# Patient Record
Sex: Male | Born: 1995 | Race: Black or African American | Hispanic: No | Marital: Single | Smoking: Never smoker
Health system: Southern US, Community
[De-identification: ages and names within clinical notes are randomized; demographics above are authoritative.]

## PROBLEM LIST (undated history)

## (undated) DIAGNOSIS — R159 Full incontinence of feces: Secondary | ICD-10-CM

## (undated) DIAGNOSIS — Q059 Spina bifida, unspecified: Secondary | ICD-10-CM

## (undated) DIAGNOSIS — I10 Essential (primary) hypertension: Secondary | ICD-10-CM

## (undated) DIAGNOSIS — N179 Acute kidney failure, unspecified: Secondary | ICD-10-CM

## (undated) DIAGNOSIS — R32 Unspecified urinary incontinence: Secondary | ICD-10-CM

## (undated) HISTORY — DX: Unspecified urinary incontinence: R32

## (undated) HISTORY — DX: Full incontinence of feces: R15.9

## (undated) HISTORY — DX: Acute kidney failure, unspecified: N17.9

## (undated) HISTORY — DX: Spina bifida, unspecified: Q05.9

## (undated) HISTORY — DX: Essential (primary) hypertension: I10

## (undated) HISTORY — PX: TYMPANOSTOMY TUBE PLACEMENT: SHX32

---
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
## 2016-08-11 DIAGNOSIS — R509 Fever, unspecified: Secondary | ICD-10-CM | POA: Diagnosis not present

## 2016-08-11 DIAGNOSIS — Q057 Lumbar spina bifida without hydrocephalus: Secondary | ICD-10-CM | POA: Diagnosis not present

## 2016-08-11 DIAGNOSIS — R1084 Generalized abdominal pain: Secondary | ICD-10-CM | POA: Diagnosis not present

## 2016-08-11 DIAGNOSIS — N390 Urinary tract infection, site not specified: Secondary | ICD-10-CM | POA: Diagnosis not present

## 2016-11-09 DIAGNOSIS — Q059 Spina bifida, unspecified: Secondary | ICD-10-CM | POA: Diagnosis not present

## 2016-11-09 DIAGNOSIS — Z0000 Encounter for general adult medical examination without abnormal findings: Secondary | ICD-10-CM | POA: Diagnosis not present

## 2016-11-09 DIAGNOSIS — I10 Essential (primary) hypertension: Secondary | ICD-10-CM | POA: Diagnosis not present

## 2016-11-09 DIAGNOSIS — L8990 Pressure ulcer of unspecified site, unspecified stage: Secondary | ICD-10-CM | POA: Diagnosis not present

## 2016-11-09 DIAGNOSIS — R32 Unspecified urinary incontinence: Secondary | ICD-10-CM | POA: Diagnosis not present

## 2016-11-09 DIAGNOSIS — R159 Full incontinence of feces: Secondary | ICD-10-CM | POA: Diagnosis not present

## 2017-01-06 ENCOUNTER — Encounter: Payer: Self-pay | Admitting: *Deleted

## 2017-01-07 ENCOUNTER — Ambulatory Visit (INDEPENDENT_AMBULATORY_CARE_PROVIDER_SITE_OTHER): Payer: Medicare HMO | Admitting: Neurology

## 2017-01-07 ENCOUNTER — Encounter: Payer: Self-pay | Admitting: Neurology

## 2017-01-07 DIAGNOSIS — Q057 Lumbar spina bifida without hydrocephalus: Secondary | ICD-10-CM

## 2017-01-07 DIAGNOSIS — Q059 Spina bifida, unspecified: Secondary | ICD-10-CM | POA: Insufficient documentation

## 2017-01-07 NOTE — Progress Notes (Signed)
Reason for visit: Spina bifida  Referring physician: Dr. Burnadette Pop Gabriel Bryant is a 21 y.o. male  History of present illness:  Gabriel Bryant is a 21 year old right-handed black male with a history of spina bifida with impairment of the L5 and sacral levels.  The patient is wheelchair-bound, he has a problem with a hypotonic bladder and difficulty controlling the bowels.  He has chronic constipation, he may have some diarrhea at times.  He is having a bowel movement about once every other day.  He does in and out catheterizations every 3-4 hours.  He denies any discomfort, he does have some numbness in the feet.  He has had a small pressure sore on the buttock area that he is monitoring.  The patient denies any neck pain or pain down the arms or numbness or weakness of the arms.  He does report relatively frequent urinary tract infections.  He comes to this office for an evaluation.   Past Medical History:  Diagnosis Date  . Acute kidney failure (HCC) 2013  . Bowel and bladder incontinence   . Hypertension   . Spina bifida [facility])     Past Surgical History:  Procedure Laterality Date  . LEG SURGERY  2000  . TYMPANOSTOMY TUBE PLACEMENT      History reviewed. No pertinent family history.  Social history:  reports that he has never smoked. He has never used smokeless tobacco. He reports that he drinks alcohol. He reports that he does not use drugs.  Medications:  Prior to Admission medications   Medication Sig Start Date End Date Taking? Authorizing Provider  chlorthalidone (HYGROTON) 25 MG tablet Take 25 mg by mouth daily.   Yes [provider]     Not on File  ROS:  Out of a complete 14 system review of symptoms, the patient complains only of the following symptoms, and all other reviewed systems are negative.  Fecal incontinence, diarrhea, constipation Urinary incontinence  Blood pressure 132/68, pulse 74, height 4\' 10"  (1.473 m).  Physical  Exam  General: The patient is alert and cooperative at the time of the examination.  Eyes: Pupils are equal, round, and reactive to light. Discs are flat bilaterally.  Neck: The neck is supple, no carotid bruits are noted.  Respiratory: The respiratory examination is clear.  Cardiovascular: The cardiovascular examination reveals a regular rate and rhythm, no obvious murmurs or rubs are noted.  Skin: Extremities are without significant edema.  Atrophy below the knees are seen bilaterally.  Neurologic Exam  Mental status: The patient is alert and oriented x 3 at the time of the examination. The patient has apparent normal recent and remote memory, with an apparently normal attention span and concentration ability.  Cranial nerves: Facial symmetry is present. There is good sensation of the face to pinprick and soft touch bilaterally. The strength of the facial muscles and the muscles to head turning and shoulder shrug are normal bilaterally. Speech is well enunciated, no aphasia or dysarthria is noted. Extraocular movements are full. Visual fields are full. The tongue is midline, and the patient has symmetric elevation of the soft palate. No obvious hearing deficits are noted.  Motor: The motor testing reveals 5 over 5 strength of the upper extremities.  With the legs, the patient has good strength with hip flexion, knee extension, adduction of the thighs, but there is 4-/5 strength with abduction of the thighs and with knee flexion.  No voluntary control with dorsi or plantarflexion  of the feet is seen.  Good symmetric motor tone is noted in the arms, decreased motor tone below the knees in the legs.  Sensory: Sensory testing is intact to pinprick, soft touch, vibration sensation, and position sense on all 4 extremities, with exception of decreased pinprick sensation on the feet bilaterally. No evidence of extinction is noted.  Coordination: Cerebellar testing reveals good finger-nose-finger  and heel-to-shin bilaterally.  Gait and station: Gait was not tested, the patient is nonambulatory, in a wheelchair  Reflexes: Deep tendon reflexes are symmetric and normal bilaterally, with exception of decreased ankle jerk reflexes bilaterally. Toes are downgoing bilaterally.   Assessment/Plan:  1.  Spina bifida  The patient has weakness from the L5 level down.  He has a hypotonic bladder, he catheterizes himself every 3-4 hours.  I have recommended that he be on vitamin C 500 mg 3 times daily and take cranberry tablets.  The patient may need a referral to urology, he did not wish to have one at this time.  The bowel regimen may be facilitated by taking MiraLAX on a daily basis and take the Dulcolax suppositories if he goes more than 2 or 3 days without a bowel movement.  The patient will follow-up in 1 year, sooner if needed.  The patient is to monitor the pressure sore closely.  Gabriel Bryant. Gabriel Levell Tavano MD 01/07/2017 11:35 AM  [facility] [address] [place], [place] [id number]  Phone [phone] Fax [phone]

## 2017-01-07 NOTE — Patient Instructions (Addendum)
   Take vitamin C 500 mg three times a day. Take cranberry tablets.

## 2017-02-09 DIAGNOSIS — R339 Retention of urine, unspecified: Secondary | ICD-10-CM | POA: Diagnosis not present

## 2017-03-03 DIAGNOSIS — N3281 Overactive bladder: Secondary | ICD-10-CM | POA: Diagnosis not present

## 2017-03-03 DIAGNOSIS — R338 Other retention of urine: Secondary | ICD-10-CM | POA: Diagnosis not present

## 2017-03-25 DIAGNOSIS — R3914 Feeling of incomplete bladder emptying: Secondary | ICD-10-CM | POA: Diagnosis not present

## 2017-03-25 DIAGNOSIS — R338 Other retention of urine: Secondary | ICD-10-CM | POA: Diagnosis not present

## 2017-04-07 DIAGNOSIS — N310 Uninhibited neuropathic bladder, not elsewhere classified: Secondary | ICD-10-CM | POA: Diagnosis not present

## 2017-04-07 DIAGNOSIS — N311 Reflex neuropathic bladder, not elsewhere classified: Secondary | ICD-10-CM | POA: Diagnosis not present

## 2017-04-07 DIAGNOSIS — N3941 Urge incontinence: Secondary | ICD-10-CM | POA: Diagnosis not present

## 2017-04-07 DIAGNOSIS — N270 Small kidney, unilateral: Secondary | ICD-10-CM | POA: Diagnosis not present

## 2017-05-02 DIAGNOSIS — R3914 Feeling of incomplete bladder emptying: Secondary | ICD-10-CM | POA: Diagnosis not present

## 2017-05-02 DIAGNOSIS — N311 Reflex neuropathic bladder, not elsewhere classified: Secondary | ICD-10-CM | POA: Diagnosis not present

## 2017-05-12 DIAGNOSIS — N3281 Overactive bladder: Secondary | ICD-10-CM | POA: Diagnosis not present

## 2017-05-12 DIAGNOSIS — N3941 Urge incontinence: Secondary | ICD-10-CM | POA: Diagnosis not present

## 2017-05-19 DIAGNOSIS — R198 Other specified symptoms and signs involving the digestive system and abdomen: Secondary | ICD-10-CM | POA: Diagnosis not present

## 2017-05-26 DIAGNOSIS — R339 Retention of urine, unspecified: Secondary | ICD-10-CM | POA: Diagnosis not present

## 2017-06-29 DIAGNOSIS — R339 Retention of urine, unspecified: Secondary | ICD-10-CM | POA: Diagnosis not present

## 2017-07-26 DIAGNOSIS — R198 Other specified symptoms and signs involving the digestive system and abdomen: Secondary | ICD-10-CM | POA: Diagnosis not present

## 2017-07-28 ENCOUNTER — Other Ambulatory Visit: Payer: Self-pay | Admitting: Gastroenterology

## 2017-07-28 ENCOUNTER — Ambulatory Visit
Admission: RE | Admit: 2017-07-28 | Discharge: 2017-07-28 | Disposition: A | Discharge status: Home / Self Care | Payer: Self-pay | Source: Ambulatory Visit | Attending: Gastroenterology | Admitting: Gastroenterology

## 2017-07-28 DIAGNOSIS — Q059 Spina bifida, unspecified: Secondary | ICD-10-CM

## 2017-07-28 DIAGNOSIS — K5900 Constipation, unspecified: Secondary | ICD-10-CM | POA: Diagnosis not present

## 2017-07-28 DIAGNOSIS — R198 Other specified symptoms and signs involving the digestive system and abdomen: Secondary | ICD-10-CM

## 2017-07-28 DIAGNOSIS — R197 Diarrhea, unspecified: Secondary | ICD-10-CM | POA: Diagnosis not present

## 2017-08-04 DIAGNOSIS — R339 Retention of urine, unspecified: Secondary | ICD-10-CM | POA: Diagnosis not present

## 2017-08-11 DIAGNOSIS — N3941 Urge incontinence: Secondary | ICD-10-CM | POA: Diagnosis not present

## 2017-08-11 DIAGNOSIS — N311 Reflex neuropathic bladder, not elsewhere classified: Secondary | ICD-10-CM | POA: Diagnosis not present

## 2017-08-11 DIAGNOSIS — N310 Uninhibited neuropathic bladder, not elsewhere classified: Secondary | ICD-10-CM | POA: Diagnosis not present

## 2017-09-07 DIAGNOSIS — R339 Retention of urine, unspecified: Secondary | ICD-10-CM | POA: Diagnosis not present

## 2017-09-07 DIAGNOSIS — N319 Neuromuscular dysfunction of bladder, unspecified: Secondary | ICD-10-CM | POA: Diagnosis not present

## 2017-10-07 DIAGNOSIS — R339 Retention of urine, unspecified: Secondary | ICD-10-CM | POA: Diagnosis not present

## 2017-10-07 DIAGNOSIS — N319 Neuromuscular dysfunction of bladder, unspecified: Secondary | ICD-10-CM | POA: Diagnosis not present

## 2017-11-01 DIAGNOSIS — Z823 Family history of stroke: Secondary | ICD-10-CM | POA: Diagnosis not present

## 2017-11-01 DIAGNOSIS — R32 Unspecified urinary incontinence: Secondary | ICD-10-CM | POA: Diagnosis not present

## 2017-11-01 DIAGNOSIS — Z91040 Latex allergy status: Secondary | ICD-10-CM | POA: Diagnosis not present

## 2017-11-01 DIAGNOSIS — K5900 Constipation, unspecified: Secondary | ICD-10-CM | POA: Diagnosis not present

## 2017-11-01 DIAGNOSIS — I10 Essential (primary) hypertension: Secondary | ICD-10-CM | POA: Diagnosis not present

## 2017-11-01 DIAGNOSIS — R269 Unspecified abnormalities of gait and mobility: Secondary | ICD-10-CM | POA: Diagnosis not present

## 2017-11-01 DIAGNOSIS — Z833 Family history of diabetes mellitus: Secondary | ICD-10-CM | POA: Diagnosis not present

## 2017-11-01 DIAGNOSIS — Z87440 Personal history of urinary (tract) infections: Secondary | ICD-10-CM | POA: Diagnosis not present

## 2017-11-01 DIAGNOSIS — Z8249 Family history of ischemic heart disease and other diseases of the circulatory system: Secondary | ICD-10-CM | POA: Diagnosis not present

## 2017-11-01 DIAGNOSIS — G809 Cerebral palsy, unspecified: Secondary | ICD-10-CM | POA: Diagnosis not present

## 2017-11-11 DIAGNOSIS — R339 Retention of urine, unspecified: Secondary | ICD-10-CM | POA: Diagnosis not present

## 2017-11-11 DIAGNOSIS — N319 Neuromuscular dysfunction of bladder, unspecified: Secondary | ICD-10-CM | POA: Diagnosis not present

## 2017-11-11 DIAGNOSIS — N390 Urinary tract infection, site not specified: Secondary | ICD-10-CM | POA: Diagnosis not present

## 2017-11-11 DIAGNOSIS — N3941 Urge incontinence: Secondary | ICD-10-CM | POA: Diagnosis not present

## 2017-11-15 DIAGNOSIS — R197 Diarrhea, unspecified: Secondary | ICD-10-CM | POA: Diagnosis not present

## 2017-11-15 DIAGNOSIS — K5900 Constipation, unspecified: Secondary | ICD-10-CM | POA: Diagnosis not present

## 2017-11-24 DIAGNOSIS — N3281 Overactive bladder: Secondary | ICD-10-CM | POA: Diagnosis not present

## 2017-11-24 DIAGNOSIS — N3941 Urge incontinence: Secondary | ICD-10-CM | POA: Diagnosis not present

## 2017-12-14 DIAGNOSIS — R339 Retention of urine, unspecified: Secondary | ICD-10-CM | POA: Diagnosis not present

## 2017-12-14 DIAGNOSIS — N319 Neuromuscular dysfunction of bladder, unspecified: Secondary | ICD-10-CM | POA: Diagnosis not present

## 2017-12-19 DIAGNOSIS — Z0000 Encounter for general adult medical examination without abnormal findings: Secondary | ICD-10-CM | POA: Diagnosis not present

## 2017-12-19 DIAGNOSIS — B351 Tinea unguium: Secondary | ICD-10-CM | POA: Diagnosis not present

## 2017-12-19 DIAGNOSIS — Z23 Encounter for immunization: Secondary | ICD-10-CM | POA: Diagnosis not present

## 2017-12-19 DIAGNOSIS — I10 Essential (primary) hypertension: Secondary | ICD-10-CM | POA: Diagnosis not present

## 2018-01-12 ENCOUNTER — Ambulatory Visit: Payer: Medicare HMO | Admitting: Neurology

## 2018-01-17 DIAGNOSIS — R339 Retention of urine, unspecified: Secondary | ICD-10-CM | POA: Diagnosis not present

## 2018-01-17 DIAGNOSIS — N390 Urinary tract infection, site not specified: Secondary | ICD-10-CM | POA: Diagnosis not present

## 2018-01-17 DIAGNOSIS — N319 Neuromuscular dysfunction of bladder, unspecified: Secondary | ICD-10-CM | POA: Diagnosis not present

## 2018-02-22 DIAGNOSIS — R339 Retention of urine, unspecified: Secondary | ICD-10-CM | POA: Diagnosis not present

## 2018-03-28 DIAGNOSIS — M792 Neuralgia and neuritis, unspecified: Secondary | ICD-10-CM | POA: Diagnosis not present

## 2018-03-28 DIAGNOSIS — G8929 Other chronic pain: Secondary | ICD-10-CM | POA: Diagnosis not present

## 2018-03-28 DIAGNOSIS — G809 Cerebral palsy, unspecified: Secondary | ICD-10-CM | POA: Diagnosis not present

## 2018-03-28 DIAGNOSIS — R69 Illness, unspecified: Secondary | ICD-10-CM | POA: Diagnosis not present

## 2018-03-28 DIAGNOSIS — Q059 Spina bifida, unspecified: Secondary | ICD-10-CM | POA: Diagnosis not present

## 2018-03-28 DIAGNOSIS — M1990 Unspecified osteoarthritis, unspecified site: Secondary | ICD-10-CM | POA: Diagnosis not present

## 2018-03-28 DIAGNOSIS — I10 Essential (primary) hypertension: Secondary | ICD-10-CM | POA: Diagnosis not present

## 2018-03-28 DIAGNOSIS — K5900 Constipation, unspecified: Secondary | ICD-10-CM | POA: Diagnosis not present

## 2018-03-28 DIAGNOSIS — N529 Male erectile dysfunction, unspecified: Secondary | ICD-10-CM | POA: Diagnosis not present

## 2018-04-20 DIAGNOSIS — N3941 Urge incontinence: Secondary | ICD-10-CM | POA: Diagnosis not present

## 2018-05-04 DIAGNOSIS — N3281 Overactive bladder: Secondary | ICD-10-CM | POA: Diagnosis not present

## 2018-05-04 DIAGNOSIS — N3941 Urge incontinence: Secondary | ICD-10-CM | POA: Diagnosis not present

## 2018-06-15 DIAGNOSIS — N319 Neuromuscular dysfunction of bladder, unspecified: Secondary | ICD-10-CM | POA: Diagnosis not present

## 2018-06-15 DIAGNOSIS — R339 Retention of urine, unspecified: Secondary | ICD-10-CM | POA: Diagnosis not present

## 2018-06-20 DIAGNOSIS — I10 Essential (primary) hypertension: Secondary | ICD-10-CM | POA: Diagnosis not present

## 2018-06-20 DIAGNOSIS — B351 Tinea unguium: Secondary | ICD-10-CM | POA: Diagnosis not present

## 2018-06-20 DIAGNOSIS — Q059 Spina bifida, unspecified: Secondary | ICD-10-CM | POA: Diagnosis not present

## 2018-06-21 DIAGNOSIS — Q059 Spina bifida, unspecified: Secondary | ICD-10-CM | POA: Diagnosis not present

## 2018-07-12 DIAGNOSIS — N319 Neuromuscular dysfunction of bladder, unspecified: Secondary | ICD-10-CM | POA: Diagnosis not present

## 2018-07-12 DIAGNOSIS — N390 Urinary tract infection, site not specified: Secondary | ICD-10-CM | POA: Diagnosis not present

## 2018-07-12 DIAGNOSIS — R339 Retention of urine, unspecified: Secondary | ICD-10-CM | POA: Diagnosis not present

## 2018-07-17 DIAGNOSIS — L8990 Pressure ulcer of unspecified site, unspecified stage: Secondary | ICD-10-CM | POA: Diagnosis not present

## 2018-07-17 DIAGNOSIS — Q059 Spina bifida, unspecified: Secondary | ICD-10-CM | POA: Diagnosis not present

## 2018-07-21 DIAGNOSIS — Q059 Spina bifida, unspecified: Secondary | ICD-10-CM | POA: Diagnosis not present

## 2018-07-28 DIAGNOSIS — R159 Full incontinence of feces: Secondary | ICD-10-CM | POA: Diagnosis not present

## 2018-07-28 DIAGNOSIS — K5909 Other constipation: Secondary | ICD-10-CM | POA: Diagnosis not present

## 2018-08-09 DIAGNOSIS — R339 Retention of urine, unspecified: Secondary | ICD-10-CM | POA: Diagnosis not present

## 2018-08-09 DIAGNOSIS — N319 Neuromuscular dysfunction of bladder, unspecified: Secondary | ICD-10-CM | POA: Diagnosis not present

## 2018-08-09 DIAGNOSIS — N390 Urinary tract infection, site not specified: Secondary | ICD-10-CM | POA: Diagnosis not present

## 2018-08-10 DIAGNOSIS — N3281 Overactive bladder: Secondary | ICD-10-CM | POA: Diagnosis not present

## 2018-08-10 DIAGNOSIS — R338 Other retention of urine: Secondary | ICD-10-CM | POA: Diagnosis not present

## 2018-08-10 DIAGNOSIS — N3941 Urge incontinence: Secondary | ICD-10-CM | POA: Diagnosis not present

## 2018-08-21 DIAGNOSIS — Q059 Spina bifida, unspecified: Secondary | ICD-10-CM | POA: Diagnosis not present

## 2018-08-30 DIAGNOSIS — N311 Reflex neuropathic bladder, not elsewhere classified: Secondary | ICD-10-CM | POA: Diagnosis not present

## 2018-08-30 DIAGNOSIS — N310 Uninhibited neuropathic bladder, not elsewhere classified: Secondary | ICD-10-CM | POA: Diagnosis not present

## 2018-09-20 DIAGNOSIS — Z7409 Other reduced mobility: Secondary | ICD-10-CM | POA: Diagnosis not present

## 2018-09-20 DIAGNOSIS — Q059 Spina bifida, unspecified: Secondary | ICD-10-CM | POA: Diagnosis not present

## 2018-09-20 DIAGNOSIS — R2689 Other abnormalities of gait and mobility: Secondary | ICD-10-CM | POA: Diagnosis not present

## 2018-09-20 DIAGNOSIS — Z993 Dependence on wheelchair: Secondary | ICD-10-CM | POA: Diagnosis not present

## 2018-09-20 DIAGNOSIS — Z872 Personal history of diseases of the skin and subcutaneous tissue: Secondary | ICD-10-CM | POA: Diagnosis not present

## 2018-09-20 DIAGNOSIS — M549 Dorsalgia, unspecified: Secondary | ICD-10-CM | POA: Diagnosis not present

## 2018-09-21 DIAGNOSIS — R339 Retention of urine, unspecified: Secondary | ICD-10-CM | POA: Diagnosis not present

## 2018-09-21 DIAGNOSIS — N319 Neuromuscular dysfunction of bladder, unspecified: Secondary | ICD-10-CM | POA: Diagnosis not present

## 2018-09-21 DIAGNOSIS — N390 Urinary tract infection, site not specified: Secondary | ICD-10-CM | POA: Diagnosis not present

## 2018-10-03 DIAGNOSIS — R159 Full incontinence of feces: Secondary | ICD-10-CM | POA: Diagnosis not present

## 2018-10-03 DIAGNOSIS — R32 Unspecified urinary incontinence: Secondary | ICD-10-CM | POA: Diagnosis not present

## 2018-10-03 DIAGNOSIS — Q059 Spina bifida, unspecified: Secondary | ICD-10-CM | POA: Diagnosis not present

## 2018-10-03 DIAGNOSIS — Z0000 Encounter for general adult medical examination without abnormal findings: Secondary | ICD-10-CM | POA: Diagnosis not present

## 2018-10-03 DIAGNOSIS — I10 Essential (primary) hypertension: Secondary | ICD-10-CM | POA: Diagnosis not present

## 2018-10-20 DIAGNOSIS — N3020 Other chronic cystitis without hematuria: Secondary | ICD-10-CM | POA: Diagnosis not present

## 2018-10-20 DIAGNOSIS — N310 Uninhibited neuropathic bladder, not elsewhere classified: Secondary | ICD-10-CM | POA: Diagnosis not present

## 2018-10-21 DIAGNOSIS — Q059 Spina bifida, unspecified: Secondary | ICD-10-CM | POA: Diagnosis not present

## 2018-11-08 DIAGNOSIS — N3941 Urge incontinence: Secondary | ICD-10-CM | POA: Diagnosis not present

## 2018-11-14 DIAGNOSIS — R339 Retention of urine, unspecified: Secondary | ICD-10-CM | POA: Diagnosis not present

## 2018-11-14 DIAGNOSIS — N319 Neuromuscular dysfunction of bladder, unspecified: Secondary | ICD-10-CM | POA: Diagnosis not present

## 2018-11-14 DIAGNOSIS — N390 Urinary tract infection, site not specified: Secondary | ICD-10-CM | POA: Diagnosis not present

## 2018-11-15 DIAGNOSIS — Q059 Spina bifida, unspecified: Secondary | ICD-10-CM | POA: Diagnosis not present

## 2018-11-21 DIAGNOSIS — Q059 Spina bifida, unspecified: Secondary | ICD-10-CM | POA: Diagnosis not present

## 2018-11-23 DIAGNOSIS — N310 Uninhibited neuropathic bladder, not elsewhere classified: Secondary | ICD-10-CM | POA: Diagnosis not present

## 2018-11-23 DIAGNOSIS — N311 Reflex neuropathic bladder, not elsewhere classified: Secondary | ICD-10-CM | POA: Diagnosis not present

## 2018-11-23 DIAGNOSIS — N3281 Overactive bladder: Secondary | ICD-10-CM | POA: Diagnosis not present

## 2018-11-23 DIAGNOSIS — N3941 Urge incontinence: Secondary | ICD-10-CM | POA: Diagnosis not present

## 2018-11-29 DIAGNOSIS — L89329 Pressure ulcer of left buttock, unspecified stage: Secondary | ICD-10-CM | POA: Diagnosis not present

## 2018-11-29 DIAGNOSIS — Q059 Spina bifida, unspecified: Secondary | ICD-10-CM | POA: Diagnosis not present

## 2018-12-09 IMAGING — DX DG ABDOMEN 2V
2 series · 2 of 2 positions shown · non-contrast
Comparison: None.

CLINICAL DATA: Alternating constipation and diarrhea.

EXAM:
ABDOMEN - 2 VIEW

[dg abd 2 views (1 of 2)]
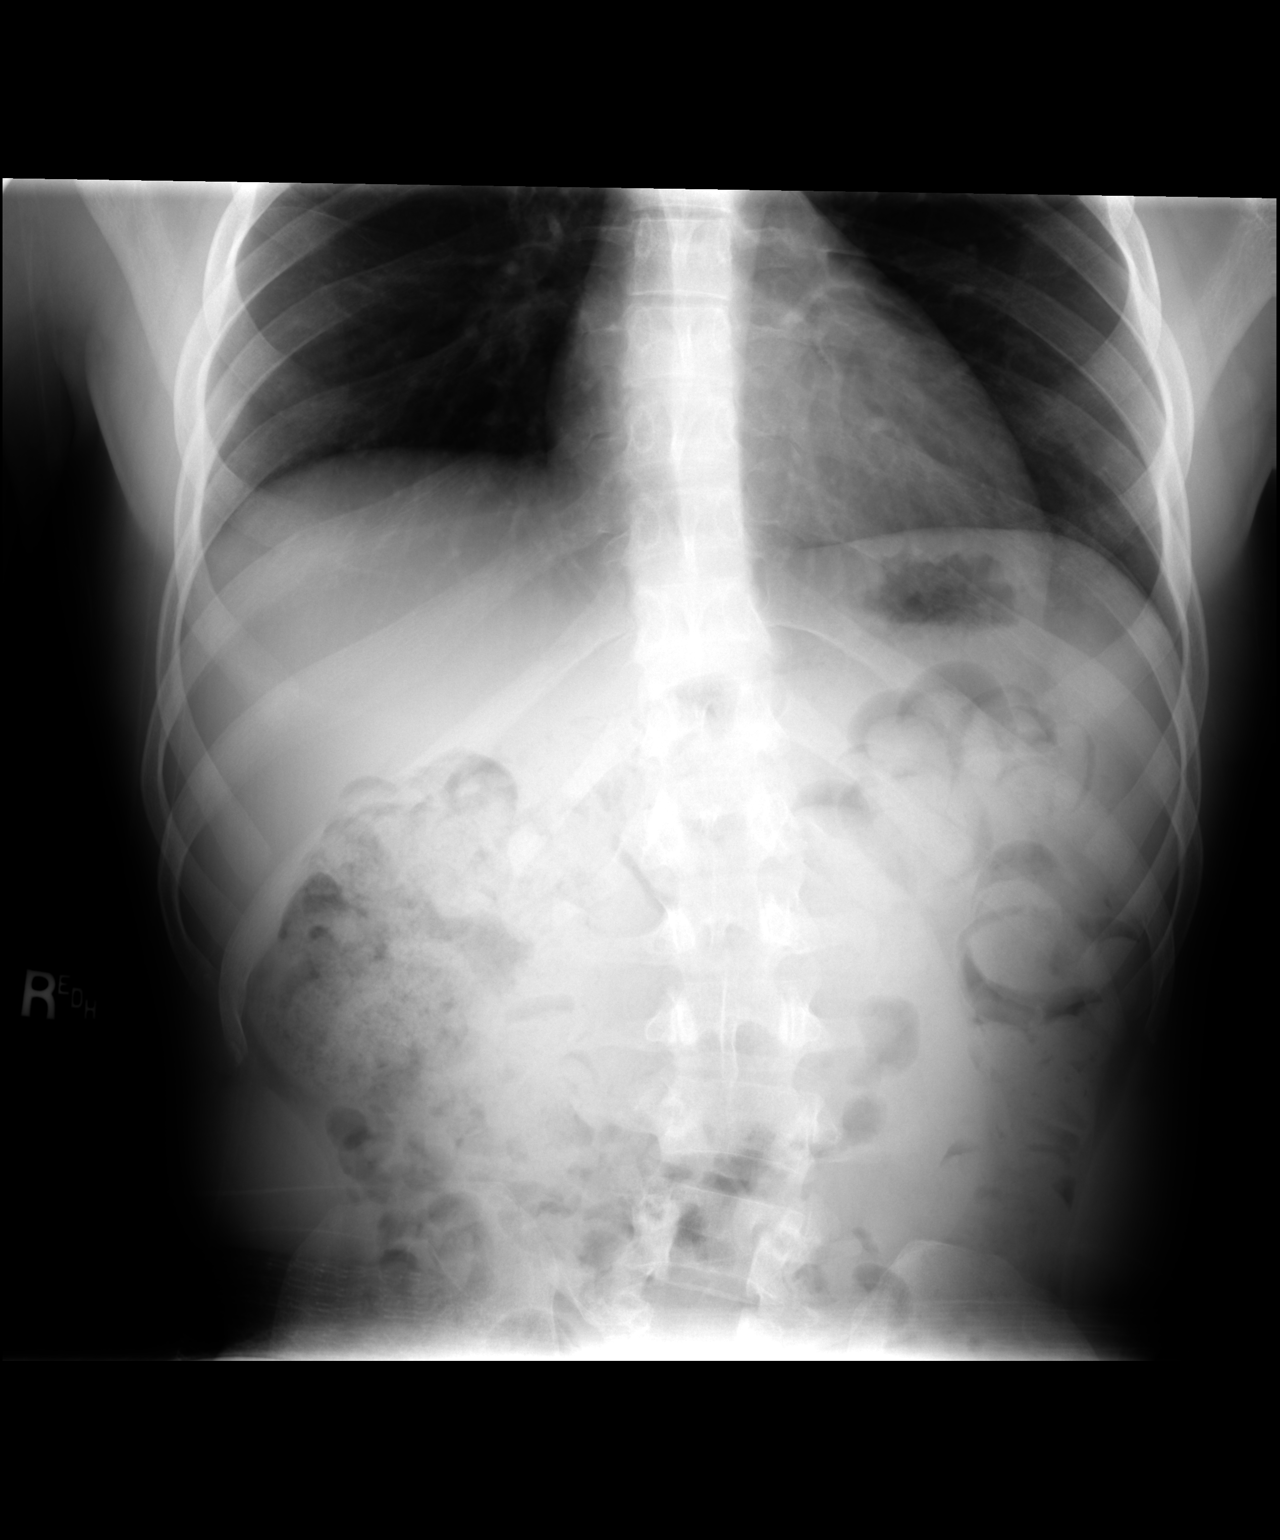

[dg abd 2 views (2 of 2)]
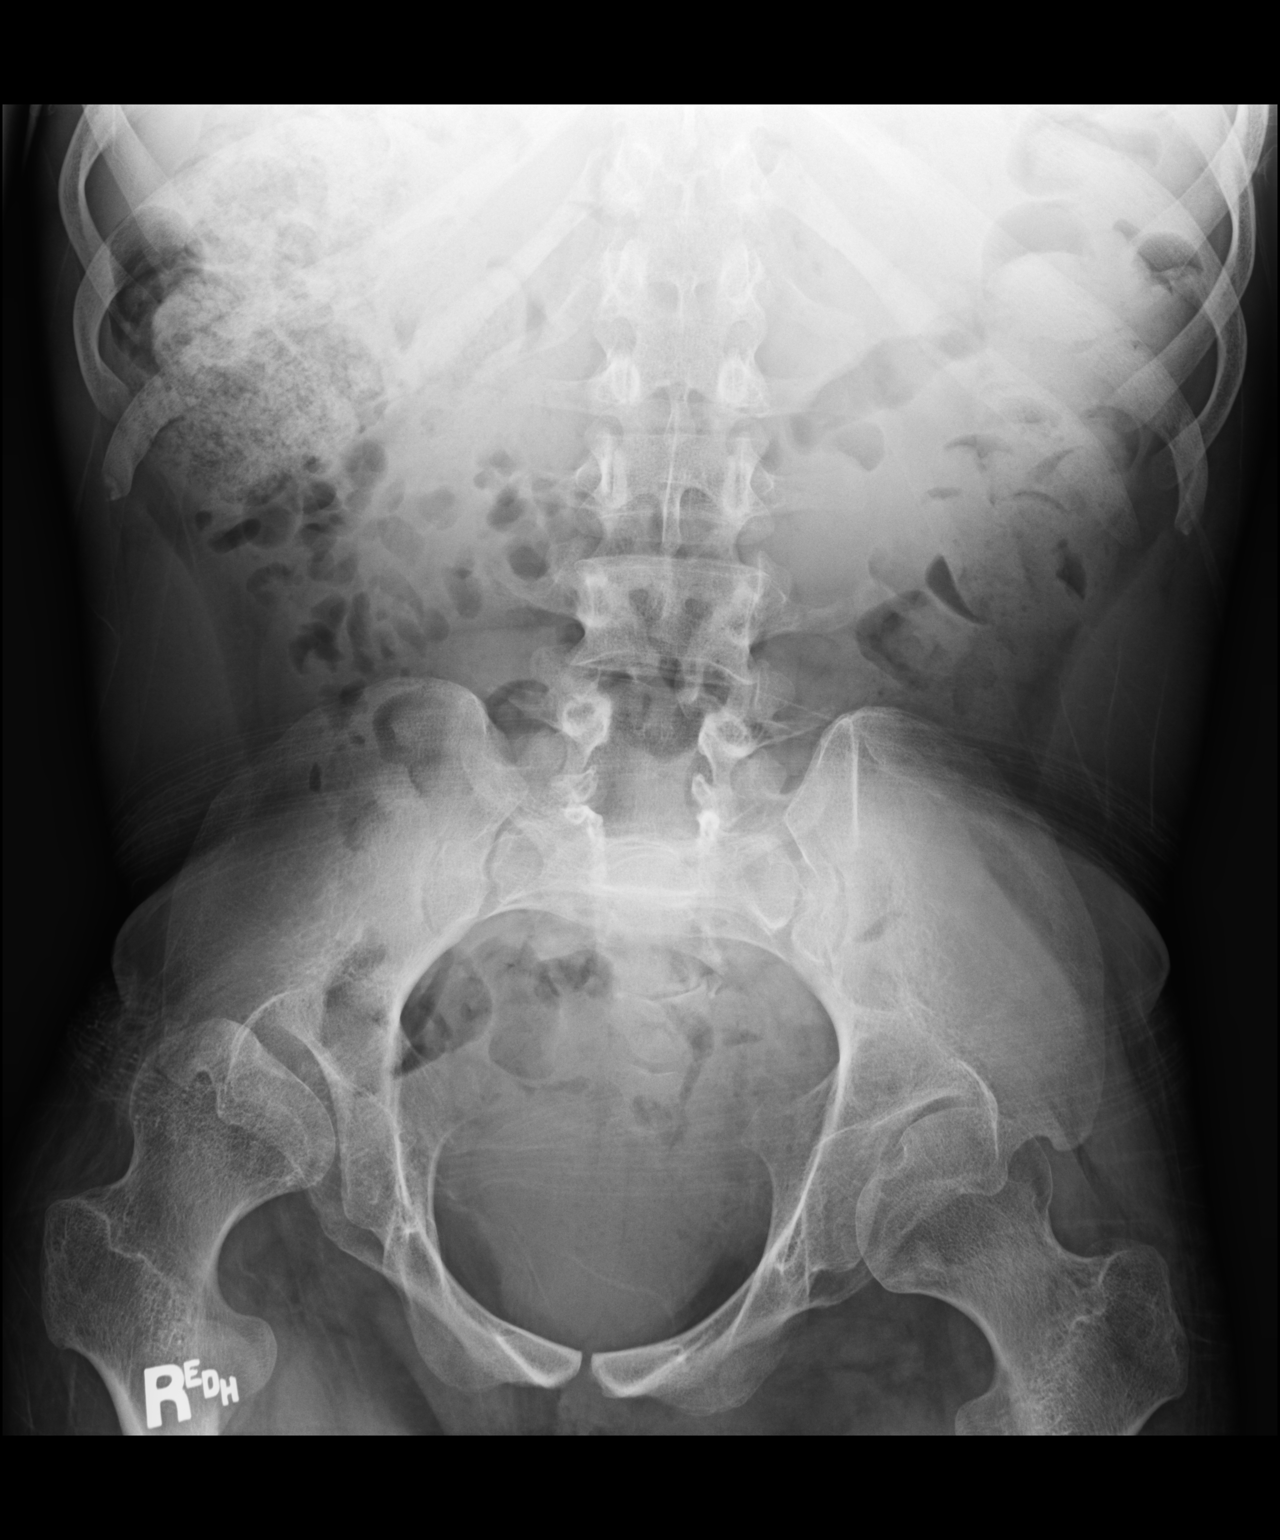

[2 of 2 positions shown; findings below may reference images not displayed]

FINDINGS: A moderate to large amount of stool within the colon noted.

No dilated bowel loops are present.

There is no evidence of bowel obstruction.

LOWER lumbar spina bifida and deformity of the bony pelvis noted.
IMPRESSION: Moderate to large amount of stool within the colon which may be
reflection of constipation. No evidence of bowel obstruction.

## 2018-12-15 DIAGNOSIS — R339 Retention of urine, unspecified: Secondary | ICD-10-CM | POA: Diagnosis not present

## 2018-12-15 DIAGNOSIS — N319 Neuromuscular dysfunction of bladder, unspecified: Secondary | ICD-10-CM | POA: Diagnosis not present

## 2018-12-15 DIAGNOSIS — N390 Urinary tract infection, site not specified: Secondary | ICD-10-CM | POA: Diagnosis not present

## 2018-12-21 DIAGNOSIS — Q059 Spina bifida, unspecified: Secondary | ICD-10-CM | POA: Diagnosis not present

## 2019-01-21 DIAGNOSIS — Q059 Spina bifida, unspecified: Secondary | ICD-10-CM | POA: Diagnosis not present

## 2019-01-31 DIAGNOSIS — R339 Retention of urine, unspecified: Secondary | ICD-10-CM | POA: Diagnosis not present

## 2019-01-31 DIAGNOSIS — N319 Neuromuscular dysfunction of bladder, unspecified: Secondary | ICD-10-CM | POA: Diagnosis not present

## 2019-02-05 DIAGNOSIS — K5909 Other constipation: Secondary | ICD-10-CM | POA: Diagnosis not present

## 2019-02-05 DIAGNOSIS — R159 Full incontinence of feces: Secondary | ICD-10-CM | POA: Diagnosis not present

## 2019-02-20 DIAGNOSIS — Q059 Spina bifida, unspecified: Secondary | ICD-10-CM | POA: Diagnosis not present

## 2019-03-23 DIAGNOSIS — Q059 Spina bifida, unspecified: Secondary | ICD-10-CM | POA: Diagnosis not present

## 2019-04-03 DIAGNOSIS — K581 Irritable bowel syndrome with constipation: Secondary | ICD-10-CM | POA: Diagnosis not present

## 2019-04-03 DIAGNOSIS — Z8249 Family history of ischemic heart disease and other diseases of the circulatory system: Secondary | ICD-10-CM | POA: Diagnosis not present

## 2019-04-03 DIAGNOSIS — R32 Unspecified urinary incontinence: Secondary | ICD-10-CM | POA: Diagnosis not present

## 2019-04-03 DIAGNOSIS — L8990 Pressure ulcer of unspecified site, unspecified stage: Secondary | ICD-10-CM | POA: Diagnosis not present

## 2019-04-03 DIAGNOSIS — I10 Essential (primary) hypertension: Secondary | ICD-10-CM | POA: Diagnosis not present

## 2019-04-11 DIAGNOSIS — M2351 Chronic instability of knee, right knee: Secondary | ICD-10-CM | POA: Diagnosis not present

## 2019-04-11 DIAGNOSIS — M2352 Chronic instability of knee, left knee: Secondary | ICD-10-CM | POA: Diagnosis not present

## 2019-04-11 DIAGNOSIS — Q059 Spina bifida, unspecified: Secondary | ICD-10-CM | POA: Diagnosis not present

## 2019-05-04 DIAGNOSIS — N319 Neuromuscular dysfunction of bladder, unspecified: Secondary | ICD-10-CM | POA: Diagnosis not present

## 2019-05-04 DIAGNOSIS — R339 Retention of urine, unspecified: Secondary | ICD-10-CM | POA: Diagnosis not present

## 2019-05-30 DIAGNOSIS — R339 Retention of urine, unspecified: Secondary | ICD-10-CM | POA: Diagnosis not present

## 2019-05-30 DIAGNOSIS — L089 Local infection of the skin and subcutaneous tissue, unspecified: Secondary | ICD-10-CM | POA: Diagnosis not present

## 2019-05-30 DIAGNOSIS — S90512A Abrasion, left ankle, initial encounter: Secondary | ICD-10-CM | POA: Diagnosis not present

## 2019-05-30 DIAGNOSIS — N319 Neuromuscular dysfunction of bladder, unspecified: Secondary | ICD-10-CM | POA: Diagnosis not present

## 2019-06-07 DIAGNOSIS — R69 Illness, unspecified: Secondary | ICD-10-CM | POA: Diagnosis not present

## 2019-06-07 DIAGNOSIS — S90512D Abrasion, left ankle, subsequent encounter: Secondary | ICD-10-CM | POA: Diagnosis not present

## 2019-06-28 DIAGNOSIS — R339 Retention of urine, unspecified: Secondary | ICD-10-CM | POA: Diagnosis not present

## 2019-06-28 DIAGNOSIS — N319 Neuromuscular dysfunction of bladder, unspecified: Secondary | ICD-10-CM | POA: Diagnosis not present

## 2019-07-31 DIAGNOSIS — N319 Neuromuscular dysfunction of bladder, unspecified: Secondary | ICD-10-CM | POA: Diagnosis not present

## 2019-07-31 DIAGNOSIS — N390 Urinary tract infection, site not specified: Secondary | ICD-10-CM | POA: Diagnosis not present

## 2019-07-31 DIAGNOSIS — R339 Retention of urine, unspecified: Secondary | ICD-10-CM | POA: Diagnosis not present

## 2019-08-20 DIAGNOSIS — Q059 Spina bifida, unspecified: Secondary | ICD-10-CM | POA: Diagnosis not present

## 2019-08-20 DIAGNOSIS — I10 Essential (primary) hypertension: Secondary | ICD-10-CM | POA: Diagnosis not present

## 2019-08-20 DIAGNOSIS — K5909 Other constipation: Secondary | ICD-10-CM | POA: Diagnosis not present

## 2019-08-21 DIAGNOSIS — R339 Retention of urine, unspecified: Secondary | ICD-10-CM | POA: Diagnosis not present

## 2019-08-21 DIAGNOSIS — N319 Neuromuscular dysfunction of bladder, unspecified: Secondary | ICD-10-CM | POA: Diagnosis not present

## 2019-08-21 DIAGNOSIS — N390 Urinary tract infection, site not specified: Secondary | ICD-10-CM | POA: Diagnosis not present

## 2019-09-21 DIAGNOSIS — R339 Retention of urine, unspecified: Secondary | ICD-10-CM | POA: Diagnosis not present

## 2019-09-21 DIAGNOSIS — N319 Neuromuscular dysfunction of bladder, unspecified: Secondary | ICD-10-CM | POA: Diagnosis not present

## 2019-09-21 DIAGNOSIS — N390 Urinary tract infection, site not specified: Secondary | ICD-10-CM | POA: Diagnosis not present

## 2019-10-12 DIAGNOSIS — N319 Neuromuscular dysfunction of bladder, unspecified: Secondary | ICD-10-CM | POA: Diagnosis not present

## 2019-10-12 DIAGNOSIS — R339 Retention of urine, unspecified: Secondary | ICD-10-CM | POA: Diagnosis not present

## 2019-10-12 DIAGNOSIS — N390 Urinary tract infection, site not specified: Secondary | ICD-10-CM | POA: Diagnosis not present

## 2019-11-07 DIAGNOSIS — N390 Urinary tract infection, site not specified: Secondary | ICD-10-CM | POA: Diagnosis not present

## 2019-11-07 DIAGNOSIS — R339 Retention of urine, unspecified: Secondary | ICD-10-CM | POA: Diagnosis not present

## 2019-11-07 DIAGNOSIS — N319 Neuromuscular dysfunction of bladder, unspecified: Secondary | ICD-10-CM | POA: Diagnosis not present

## 2019-12-07 DIAGNOSIS — R339 Retention of urine, unspecified: Secondary | ICD-10-CM | POA: Diagnosis not present

## 2019-12-07 DIAGNOSIS — N319 Neuromuscular dysfunction of bladder, unspecified: Secondary | ICD-10-CM | POA: Diagnosis not present

## 2019-12-07 DIAGNOSIS — N390 Urinary tract infection, site not specified: Secondary | ICD-10-CM | POA: Diagnosis not present

## 2020-01-07 DIAGNOSIS — N319 Neuromuscular dysfunction of bladder, unspecified: Secondary | ICD-10-CM | POA: Diagnosis not present

## 2020-01-07 DIAGNOSIS — R339 Retention of urine, unspecified: Secondary | ICD-10-CM | POA: Diagnosis not present

## 2020-01-07 DIAGNOSIS — N390 Urinary tract infection, site not specified: Secondary | ICD-10-CM | POA: Diagnosis not present

## 2020-02-07 DIAGNOSIS — R339 Retention of urine, unspecified: Secondary | ICD-10-CM | POA: Diagnosis not present

## 2020-02-07 DIAGNOSIS — N390 Urinary tract infection, site not specified: Secondary | ICD-10-CM | POA: Diagnosis not present

## 2020-02-07 DIAGNOSIS — N319 Neuromuscular dysfunction of bladder, unspecified: Secondary | ICD-10-CM | POA: Diagnosis not present

## 2020-03-21 DIAGNOSIS — N3941 Urge incontinence: Secondary | ICD-10-CM | POA: Diagnosis not present

## 2020-03-21 DIAGNOSIS — K5900 Constipation, unspecified: Secondary | ICD-10-CM | POA: Diagnosis not present

## 2020-03-21 DIAGNOSIS — Q059 Spina bifida, unspecified: Secondary | ICD-10-CM | POA: Diagnosis not present

## 2020-03-21 DIAGNOSIS — I10 Essential (primary) hypertension: Secondary | ICD-10-CM | POA: Diagnosis not present

## 2020-03-21 DIAGNOSIS — Z91040 Latex allergy status: Secondary | ICD-10-CM | POA: Diagnosis not present

## 2020-03-21 DIAGNOSIS — R269 Unspecified abnormalities of gait and mobility: Secondary | ICD-10-CM | POA: Diagnosis not present

## 2020-05-06 DIAGNOSIS — N319 Neuromuscular dysfunction of bladder, unspecified: Secondary | ICD-10-CM | POA: Diagnosis not present

## 2020-05-06 DIAGNOSIS — R339 Retention of urine, unspecified: Secondary | ICD-10-CM | POA: Diagnosis not present

## 2020-05-06 DIAGNOSIS — N390 Urinary tract infection, site not specified: Secondary | ICD-10-CM | POA: Diagnosis not present

## 2023-08-04 DIAGNOSIS — N319 Neuromuscular dysfunction of bladder, unspecified: Secondary | ICD-10-CM | POA: Diagnosis not present

## 2023-08-04 DIAGNOSIS — R338 Other retention of urine: Secondary | ICD-10-CM | POA: Diagnosis not present

## 2023-08-19 DIAGNOSIS — I10 Essential (primary) hypertension: Secondary | ICD-10-CM | POA: Diagnosis not present

## 2023-08-26 DIAGNOSIS — M25562 Pain in left knee: Secondary | ICD-10-CM | POA: Diagnosis not present

## 2023-08-26 DIAGNOSIS — R252 Cramp and spasm: Secondary | ICD-10-CM | POA: Diagnosis not present

## 2023-08-31 DIAGNOSIS — M25562 Pain in left knee: Secondary | ICD-10-CM | POA: Diagnosis not present

## 2023-09-05 DIAGNOSIS — I10 Essential (primary) hypertension: Secondary | ICD-10-CM | POA: Diagnosis not present

## 2023-09-11 DIAGNOSIS — R338 Other retention of urine: Secondary | ICD-10-CM | POA: Diagnosis not present

## 2023-09-11 DIAGNOSIS — N319 Neuromuscular dysfunction of bladder, unspecified: Secondary | ICD-10-CM | POA: Diagnosis not present

## 2023-09-17 DIAGNOSIS — I10 Essential (primary) hypertension: Secondary | ICD-10-CM | POA: Diagnosis not present

## 2023-09-19 ENCOUNTER — Other Ambulatory Visit: Payer: Self-pay | Admitting: Physician Assistant

## 2023-09-19 DIAGNOSIS — M25562 Pain in left knee: Secondary | ICD-10-CM

## 2023-10-06 ENCOUNTER — Ambulatory Visit
Admission: RE | Admit: 2023-10-06 | Discharge: 2023-10-06 | Disposition: A | Discharge status: Home / Self Care | Payer: Self-pay | Source: Ambulatory Visit | Attending: Physician Assistant | Admitting: Physician Assistant

## 2023-10-06 DIAGNOSIS — M25562 Pain in left knee: Secondary | ICD-10-CM

## 2023-10-06 DIAGNOSIS — I10 Essential (primary) hypertension: Secondary | ICD-10-CM | POA: Diagnosis not present

## 2023-10-17 DIAGNOSIS — I10 Essential (primary) hypertension: Secondary | ICD-10-CM | POA: Diagnosis not present

## 2023-10-19 ENCOUNTER — Ambulatory Visit: Admitting: Orthopaedic Surgery

## 2023-10-19 ENCOUNTER — Encounter: Payer: Self-pay | Admitting: Orthopaedic Surgery

## 2023-10-19 DIAGNOSIS — Q057 Lumbar spina bifida without hydrocephalus: Secondary | ICD-10-CM

## 2023-10-19 DIAGNOSIS — M25562 Pain in left knee: Secondary | ICD-10-CM | POA: Diagnosis not present

## 2023-10-19 DIAGNOSIS — G8929 Other chronic pain: Secondary | ICD-10-CM | POA: Diagnosis not present

## 2023-10-19 MED ORDER — METHYLPREDNISOLONE ACETATE 40 MG/ML IJ SUSP
40.0000 mg | INTRAMUSCULAR | Status: AC | PRN
  Administered 2023-10-19 (×2): 40 mg via INTRA_ARTICULAR

## 2023-10-19 MED ORDER — LIDOCAINE HCL 1 % IJ SOLN
3.0000 mL | INTRAMUSCULAR | Status: AC | PRN
  Administered 2023-10-19 (×2): 3 mL

## 2023-10-19 NOTE — Progress Notes (Signed)
 The patient is more seen for the first time.  He is sent to us  after having a MRI of his left knee due to knee pain.  He is someone with a history of spina bifida since birth and he ambulates in a wheelchair.  He says he walks some but he does not think he walks much at all.  He was complaining of a left knee pain that he requested MRI of his left knee and it did show a medial meniscal tear and so he sent our way to address this situation.  He does report some occasional low back pain to the left side and some pain in the left hip as well.  He denies any trauma.  He states that the lot of his pain started after coming back from a trip.  On examination of his left knee today there is no effusion of that knee and I can put his knee gently through some range of motion.  I can also put his left hip that range of motion.  His pain seems to be a little global.   the MRI of his left knee shows the medial meniscal tear at the root with no reactive edema and no effusion.  The ACL and PCL are intact and the cartilage shows just minimal arthritis.  I am not sure this is truly a knee issue for him.  This may all be related to chronic spine and hip issues.  I did recommend a steroid injection in his left knee today so we can see if this can be both diagnostic and therapeutic for him.  He agreed to this and tolerated it well.  Will see him back in 3 weeks.  At that visit I would like an AP and lateral of the lumbar spine and an AP pelvis.  This should be done supine.    Procedure Note  Patient: Gabriel Bryant             Date of Birth: 1995/04/22           MRN: [id number]             Visit Date: 10/19/2023  Procedures: Visit Diagnoses:  1. Chronic pain of left knee   2. Spina bifida of lumbosacral region, unspecified hydrocephalus presence (HCC)     Large Joint Inj: L knee on 10/19/2023 10:46 AM Indications: diagnostic evaluation and pain Details: 22 G 1.5 in needle, superolateral  approach  Arthrogram: No  Medications: 3 mL lidocaine  1 %; 40 mg methylPREDNISolone  acetate 40 MG/ML Outcome: tolerated well, no immediate complications Procedure, treatment alternatives, risks and benefits explained, specific risks discussed. Consent was given by the patient. Immediately prior to procedure a time out was called to verify the correct patient, procedure, equipment, support staff and site/side marked as required. Patient was prepped and draped in the usual sterile fashion.

## 2023-10-23 DIAGNOSIS — N319 Neuromuscular dysfunction of bladder, unspecified: Secondary | ICD-10-CM | POA: Diagnosis not present

## 2023-10-23 DIAGNOSIS — R338 Other retention of urine: Secondary | ICD-10-CM | POA: Diagnosis not present

## 2023-11-06 DIAGNOSIS — I10 Essential (primary) hypertension: Secondary | ICD-10-CM | POA: Diagnosis not present

## 2023-11-16 ENCOUNTER — Ambulatory Visit: Admitting: Orthopaedic Surgery

## 2023-11-16 ENCOUNTER — Encounter: Payer: Self-pay | Admitting: Orthopaedic Surgery

## 2023-11-16 DIAGNOSIS — G8929 Other chronic pain: Secondary | ICD-10-CM

## 2023-11-16 DIAGNOSIS — Q057 Lumbar spina bifida without hydrocephalus: Secondary | ICD-10-CM | POA: Diagnosis not present

## 2023-11-16 DIAGNOSIS — M25562 Pain in left knee: Secondary | ICD-10-CM | POA: Diagnosis not present

## 2023-11-16 NOTE — Progress Notes (Signed)
 The patient is a 28 year old gentleman who has spina bifida and does ambulate mainly using wheelchair but he does get up on occasion.  He was sent to me due to pain in his left knee after a long trip where he was sitting quite a bit on what he describes a plane trip as well as also being in cars and trains and not mobilizing the way he normally does.  We did place a steroid injection in his left knee and he says that he cannot really tell if that helped much.  He describes pain in the back and a bit almost like another type of pain as a relates to spina bifida.  He does state though that it is slowly getting better.  He is also a patient of Dr. Montie Pizza.  On my exam today his left knee shows no instability ligamentously and there is no effusion.  He does not have active motion of his knees.  Some of the nerve pain that he is experiencing is most likely related to his spina bifida.  It is good that it is seeming to come be calming down slowly.  I think it is helpful that he is 28 years old and not a diabetic and not morbidly obese.  At this point since he is making some improvements I think follow-up can be as needed.  If the neurologic symptoms persist or seem to get worse, he would need a referral to most likely a neurologist for further evaluation.

## 2023-11-17 DIAGNOSIS — M5459 Other low back pain: Secondary | ICD-10-CM | POA: Diagnosis not present

## 2023-11-17 DIAGNOSIS — M79604 Pain in right leg: Secondary | ICD-10-CM | POA: Diagnosis not present

## 2023-11-17 DIAGNOSIS — I10 Essential (primary) hypertension: Secondary | ICD-10-CM | POA: Diagnosis not present

## 2023-11-17 DIAGNOSIS — Z1331 Encounter for screening for depression: Secondary | ICD-10-CM | POA: Diagnosis not present

## 2023-11-17 DIAGNOSIS — R32 Unspecified urinary incontinence: Secondary | ICD-10-CM | POA: Diagnosis not present

## 2023-11-17 DIAGNOSIS — M79605 Pain in left leg: Secondary | ICD-10-CM | POA: Diagnosis not present

## 2023-11-17 DIAGNOSIS — Q059 Spina bifida, unspecified: Secondary | ICD-10-CM | POA: Diagnosis not present

## 2023-11-17 DIAGNOSIS — E559 Vitamin D deficiency, unspecified: Secondary | ICD-10-CM | POA: Diagnosis not present

## 2023-11-17 DIAGNOSIS — Z0000 Encounter for general adult medical examination without abnormal findings: Secondary | ICD-10-CM | POA: Diagnosis not present

## 2023-11-25 DIAGNOSIS — R338 Other retention of urine: Secondary | ICD-10-CM | POA: Diagnosis not present

## 2023-11-25 DIAGNOSIS — N319 Neuromuscular dysfunction of bladder, unspecified: Secondary | ICD-10-CM | POA: Diagnosis not present

## 2023-11-29 DIAGNOSIS — M545 Low back pain, unspecified: Secondary | ICD-10-CM | POA: Diagnosis not present

## 2023-11-29 DIAGNOSIS — M5431 Sciatica, right side: Secondary | ICD-10-CM | POA: Diagnosis not present

## 2023-11-29 DIAGNOSIS — G8929 Other chronic pain: Secondary | ICD-10-CM | POA: Diagnosis not present

## 2023-11-29 DIAGNOSIS — Z1330 Encounter for screening examination for mental health and behavioral disorders, unspecified: Secondary | ICD-10-CM | POA: Diagnosis not present

## 2023-11-29 DIAGNOSIS — M5432 Sciatica, left side: Secondary | ICD-10-CM | POA: Diagnosis not present

## 2023-12-06 DIAGNOSIS — I10 Essential (primary) hypertension: Secondary | ICD-10-CM | POA: Diagnosis not present

## 2023-12-20 DIAGNOSIS — I10 Essential (primary) hypertension: Secondary | ICD-10-CM | POA: Diagnosis not present

## 2023-12-20 DIAGNOSIS — M5440 Lumbago with sciatica, unspecified side: Secondary | ICD-10-CM | POA: Diagnosis not present

## 2023-12-20 DIAGNOSIS — Z9981 Dependence on supplemental oxygen: Secondary | ICD-10-CM | POA: Diagnosis not present

## 2023-12-20 DIAGNOSIS — K5900 Constipation, unspecified: Secondary | ICD-10-CM | POA: Diagnosis not present

## 2023-12-20 DIAGNOSIS — R600 Localized edema: Secondary | ICD-10-CM | POA: Diagnosis not present

## 2023-12-20 DIAGNOSIS — Q059 Spina bifida, unspecified: Secondary | ICD-10-CM | POA: Diagnosis not present

## 2023-12-20 DIAGNOSIS — N319 Neuromuscular dysfunction of bladder, unspecified: Secondary | ICD-10-CM | POA: Diagnosis not present

## 2023-12-20 DIAGNOSIS — Z5982 Transportation insecurity: Secondary | ICD-10-CM | POA: Diagnosis not present

## 2023-12-22 DIAGNOSIS — I10 Essential (primary) hypertension: Secondary | ICD-10-CM | POA: Diagnosis not present

## 2023-12-27 DIAGNOSIS — M545 Low back pain, unspecified: Secondary | ICD-10-CM | POA: Diagnosis not present

## 2023-12-27 DIAGNOSIS — G8929 Other chronic pain: Secondary | ICD-10-CM | POA: Diagnosis not present

## 2023-12-27 DIAGNOSIS — M5431 Sciatica, right side: Secondary | ICD-10-CM | POA: Diagnosis not present

## 2023-12-27 DIAGNOSIS — M5432 Sciatica, left side: Secondary | ICD-10-CM | POA: Diagnosis not present

## 2024-01-06 DIAGNOSIS — I10 Essential (primary) hypertension: Secondary | ICD-10-CM | POA: Diagnosis not present

## 2024-01-09 ENCOUNTER — Encounter: Payer: Self-pay | Admitting: Radiology

## 2024-01-16 DIAGNOSIS — N319 Neuromuscular dysfunction of bladder, unspecified: Secondary | ICD-10-CM | POA: Diagnosis not present

## 2024-01-16 DIAGNOSIS — R338 Other retention of urine: Secondary | ICD-10-CM | POA: Diagnosis not present

## 2024-01-17 DIAGNOSIS — N12 Tubulo-interstitial nephritis, not specified as acute or chronic: Secondary | ICD-10-CM | POA: Diagnosis not present

## 2024-01-17 DIAGNOSIS — N390 Urinary tract infection, site not specified: Secondary | ICD-10-CM | POA: Diagnosis not present

## 2024-01-21 DIAGNOSIS — I10 Essential (primary) hypertension: Secondary | ICD-10-CM | POA: Diagnosis not present

## 2024-02-05 DIAGNOSIS — I10 Essential (primary) hypertension: Secondary | ICD-10-CM | POA: Diagnosis not present
